# Patient Record
Sex: Female | Born: 1975
Health system: Southern US, Community
[De-identification: ages and names within clinical notes are randomized; demographics above are authoritative.]

---

## 2017-02-19 DIAGNOSIS — R030 Elevated blood-pressure reading, without diagnosis of hypertension: Secondary | ICD-10-CM | POA: Diagnosis not present

## 2017-02-19 DIAGNOSIS — L989 Disorder of the skin and subcutaneous tissue, unspecified: Secondary | ICD-10-CM | POA: Diagnosis not present

## 2017-02-19 DIAGNOSIS — G2581 Restless legs syndrome: Secondary | ICD-10-CM | POA: Diagnosis not present

## 2017-02-19 DIAGNOSIS — F4329 Adjustment disorder with other symptoms: Secondary | ICD-10-CM | POA: Diagnosis not present

## 2017-03-12 ENCOUNTER — Other Ambulatory Visit: Payer: Self-pay | Admitting: Family Medicine

## 2017-03-12 DIAGNOSIS — Z1231 Encounter for screening mammogram for malignant neoplasm of breast: Secondary | ICD-10-CM

## 2017-05-19 DIAGNOSIS — Z23 Encounter for immunization: Secondary | ICD-10-CM | POA: Diagnosis not present

## 2017-05-19 DIAGNOSIS — Z0000 Encounter for general adult medical examination without abnormal findings: Secondary | ICD-10-CM | POA: Diagnosis not present

## 2017-05-19 DIAGNOSIS — Z124 Encounter for screening for malignant neoplasm of cervix: Secondary | ICD-10-CM | POA: Diagnosis not present

## 2017-05-19 DIAGNOSIS — R87610 Atypical squamous cells of undetermined significance on cytologic smear of cervix (ASC-US): Secondary | ICD-10-CM | POA: Diagnosis not present

## 2017-05-19 DIAGNOSIS — E611 Iron deficiency: Secondary | ICD-10-CM | POA: Diagnosis not present

## 2017-05-19 DIAGNOSIS — Z13220 Encounter for screening for lipoid disorders: Secondary | ICD-10-CM | POA: Diagnosis not present

## 2017-05-20 ENCOUNTER — Ambulatory Visit
Admission: RE | Admit: 2017-05-20 | Discharge: 2017-05-20 | Disposition: A | Discharge status: Home / Self Care | Payer: Federal, State, Local not specified - PPO | Source: Ambulatory Visit | Attending: Family Medicine | Admitting: Family Medicine

## 2017-05-20 DIAGNOSIS — Z1231 Encounter for screening mammogram for malignant neoplasm of breast: Secondary | ICD-10-CM

## 2017-09-16 DIAGNOSIS — E559 Vitamin D deficiency, unspecified: Secondary | ICD-10-CM | POA: Diagnosis not present

## 2017-09-16 DIAGNOSIS — D508 Other iron deficiency anemias: Secondary | ICD-10-CM | POA: Diagnosis not present

## 2017-09-16 DIAGNOSIS — M25531 Pain in right wrist: Secondary | ICD-10-CM | POA: Diagnosis not present

## 2017-09-16 DIAGNOSIS — F4329 Adjustment disorder with other symptoms: Secondary | ICD-10-CM | POA: Diagnosis not present

## 2017-09-16 DIAGNOSIS — E539 Vitamin B deficiency, unspecified: Secondary | ICD-10-CM | POA: Diagnosis not present

## 2017-10-06 DIAGNOSIS — R200 Anesthesia of skin: Secondary | ICD-10-CM | POA: Diagnosis not present

## 2018-06-03 DIAGNOSIS — Z23 Encounter for immunization: Secondary | ICD-10-CM | POA: Diagnosis not present

## 2018-06-03 DIAGNOSIS — M62838 Other muscle spasm: Secondary | ICD-10-CM | POA: Diagnosis not present

## 2018-06-03 DIAGNOSIS — F4329 Adjustment disorder with other symptoms: Secondary | ICD-10-CM | POA: Diagnosis not present

## 2018-06-03 DIAGNOSIS — M545 Low back pain: Secondary | ICD-10-CM | POA: Diagnosis not present

## 2018-08-23 DIAGNOSIS — M79641 Pain in right hand: Secondary | ICD-10-CM | POA: Diagnosis not present

## 2018-08-23 DIAGNOSIS — M79642 Pain in left hand: Secondary | ICD-10-CM | POA: Diagnosis not present

## 2018-08-23 DIAGNOSIS — R200 Anesthesia of skin: Secondary | ICD-10-CM | POA: Diagnosis not present

## 2018-08-26 ENCOUNTER — Encounter: Payer: Self-pay | Admitting: Neurology

## 2018-08-26 ENCOUNTER — Other Ambulatory Visit: Payer: Self-pay | Admitting: *Deleted

## 2018-08-26 DIAGNOSIS — M79641 Pain in right hand: Secondary | ICD-10-CM

## 2018-08-26 DIAGNOSIS — M79642 Pain in left hand: Principal | ICD-10-CM

## 2018-09-10 DIAGNOSIS — E538 Deficiency of other specified B group vitamins: Secondary | ICD-10-CM | POA: Diagnosis not present

## 2018-09-10 DIAGNOSIS — D508 Other iron deficiency anemias: Secondary | ICD-10-CM | POA: Diagnosis not present

## 2018-09-10 DIAGNOSIS — E559 Vitamin D deficiency, unspecified: Secondary | ICD-10-CM | POA: Diagnosis not present

## 2018-09-10 DIAGNOSIS — Z136 Encounter for screening for cardiovascular disorders: Secondary | ICD-10-CM | POA: Diagnosis not present

## 2018-09-15 DIAGNOSIS — Z0000 Encounter for general adult medical examination without abnormal findings: Secondary | ICD-10-CM | POA: Diagnosis not present

## 2018-09-16 ENCOUNTER — Other Ambulatory Visit: Payer: Self-pay | Admitting: Family Medicine

## 2018-09-16 ENCOUNTER — Ambulatory Visit (INDEPENDENT_AMBULATORY_CARE_PROVIDER_SITE_OTHER): Payer: Federal, State, Local not specified - PPO | Admitting: Neurology

## 2018-09-16 DIAGNOSIS — G5603 Carpal tunnel syndrome, bilateral upper limbs: Secondary | ICD-10-CM

## 2018-09-16 DIAGNOSIS — N6320 Unspecified lump in the left breast, unspecified quadrant: Secondary | ICD-10-CM

## 2018-09-16 DIAGNOSIS — M79642 Pain in left hand: Secondary | ICD-10-CM

## 2018-09-16 DIAGNOSIS — M79641 Pain in right hand: Secondary | ICD-10-CM

## 2018-09-16 NOTE — Procedures (Signed)
[facility]  [address], [address]  [place], [place] [address] Tel: [phone] Fax:  [phone] Test Date:  09/16/2018  Patient: Sharon Davis DOB: 1976/03/08 Physician: Nita Sickle, DO  Sex: Female Height: 5\' 2"  Ref Phys: Cindee Salt, MD  ID#: [id number] Temp: 35.0C Technician:    Patient Complaints: This is a 43 year old female referred for evaluation of bilateral hand tingling and pain, worse on the left.  NCV & EMG Findings: Extensive electrodiagnostic testing of the left upper extremity and additional studies of the right shows:  1. Left mixed palmar sensory responses show prolonged latency.  Right median sensory response shows prolonged distal peak latency (3.5 ms) and reduced amplitude (18.7 V).  Left median and bilateral ulnar sensory responses are within normal limits. 2. Right median motor response shows prolonged distal onset latency (4.3 ms).  Left median and bilateral ulnar motor responses are within normal limits.   3. Chronic motor axonal loss changes are seen in the right abductor pollicis brevis muscle, without accompanied active denervation.    Impression: 1. Right median neuropathy at or distal to the wrist, consistent with a clinical diagnosis of carpal tunnel syndrome, moderate in degree electrically. 2. Left median neuropathy at or distal to the wrist, consistent with a clinical diagnosis of carpal tunnel syndrome, very mild in degree electrically. 3. There is no evidence of a cervical radiculopathy affecting the upper extremities.    ___________________________ Nita Sickle, DO    Nerve Conduction Studies Anti Sensory Summary Table   Site NR Peak (ms) Norm Peak (ms) P-T Amp (V) Norm P-T Amp  Left Median Anti Sensory (2nd Digit)  35C  Wrist    3.0 <3.4 49.2 >20  Right Median Anti Sensory (2nd Digit)  35C  Wrist    3.5 <3.4 18.7 >20  Left Ulnar Anti Sensory (5th Digit)  35C  Wrist    2.6 <3.1 40.9 >12  Right Ulnar Anti Sensory  (5th Digit)  35C  Wrist    2.3 <3.1 42.7 >12   Motor Summary Table   Site NR Onset (ms) Norm Onset (ms) O-P Amp (mV) Norm O-P Amp Site1 Site2 Delta-0 (ms) Dist (cm) Vel (m/s) Norm Vel (m/s)  Left Median Motor (Abd Poll Brev)  35C  Wrist    3.0 <3.9 13.0 >6 Elbow Wrist 4.3 27.0 63 >50  Elbow    7.3  12.4         Right Median Motor (Abd Poll Brev)  35C  Wrist    4.3 <3.9 8.1 >6 Elbow Wrist 4.4 27.0 61 >50  Elbow    8.7  8.0         Left Ulnar Motor (Abd Dig Minimi)  35C  Wrist    2.1 <3.1 10.7 >7 B Elbow Wrist 3.4 22.5 66 >50  B Elbow    5.5  10.4  A Elbow B Elbow 1.5 10.0 67 >50  A Elbow    7.0  10.4         Right Ulnar Motor (Abd Dig Minimi)  35C  Wrist    1.9 <3.1 9.4 >7 B Elbow Wrist 3.3 22.0 67 >50  B Elbow    5.2  9.0  A Elbow B Elbow 1.5 10.0 67 >50  A Elbow    6.7  8.7          Comparison Summary Table   Site NR Peak (ms) Norm Peak (ms) P-T Amp (V) Site1 Site2 Delta-P (ms) Norm Delta (ms)  Left  Median/Ulnar Palm Comparison (Wrist - 8cm)  35C  Median Palm    1.9 <2.2 112.0 Median Palm Ulnar Palm 0.5   Ulnar Palm    1.4 <2.2 55.0       EMG   Side Muscle Ins Act Fibs Psw Fasc Number Recrt Dur Dur. Amp Amp. Poly Poly. Comment  Right 1stDorInt Nml Nml Nml Nml Nml Nml Nml Nml Nml Nml Nml Nml N/A  Right Abd Poll Brev Nml Nml Nml Nml 1- Rapid Few 1+ Few 1+ Nml Nml N/A  Right PronatorTeres Nml Nml Nml Nml Nml Nml Nml Nml Nml Nml Nml Nml N/A  Right Biceps Nml Nml Nml Nml Nml Nml Nml Nml Nml Nml Nml Nml N/A  Right Deltoid Nml Nml Nml Nml Nml Nml Nml Nml Nml Nml Nml Nml N/A  Right Triceps Nml Nml Nml Nml Nml Nml Nml Nml Nml Nml Nml Nml N/A  Left 1stDorInt Nml Nml Nml Nml Nml Nml Nml Nml Nml Nml Nml Nml N/A  Left Abd Poll Brev Nml Nml Nml Nml Nml Nml Nml Nml Nml Nml Nml Nml N/A  Left PronatorTeres Nml Nml Nml Nml Nml Nml Nml Nml Nml Nml Nml Nml N/A  Left Biceps Nml Nml Nml Nml Nml Nml Nml Nml Nml Nml Nml Nml N/A  Left Triceps Nml Nml Nml Nml Nml Nml Nml Nml Nml Nml Nml Nml  N/A  Left Deltoid Nml Nml Nml Nml Nml Nml Nml Nml Nml Nml Nml Nml N/A      Waveforms:

## 2018-09-23 ENCOUNTER — Ambulatory Visit
Admission: RE | Admit: 2018-09-23 | Discharge: 2018-09-23 | Disposition: A | Discharge status: Home / Self Care | Payer: Federal, State, Local not specified - PPO | Source: Ambulatory Visit | Attending: Family Medicine | Admitting: Family Medicine

## 2018-09-23 DIAGNOSIS — N6320 Unspecified lump in the left breast, unspecified quadrant: Secondary | ICD-10-CM

## 2018-09-23 DIAGNOSIS — N6001 Solitary cyst of right breast: Secondary | ICD-10-CM | POA: Diagnosis not present

## 2018-09-23 DIAGNOSIS — R922 Inconclusive mammogram: Secondary | ICD-10-CM | POA: Diagnosis not present

## 2018-10-01 DIAGNOSIS — R200 Anesthesia of skin: Secondary | ICD-10-CM | POA: Diagnosis not present

## 2019-01-17 DIAGNOSIS — R200 Anesthesia of skin: Secondary | ICD-10-CM | POA: Diagnosis not present

## 2019-01-17 DIAGNOSIS — M542 Cervicalgia: Secondary | ICD-10-CM | POA: Diagnosis not present

## 2019-01-17 DIAGNOSIS — M5412 Radiculopathy, cervical region: Secondary | ICD-10-CM | POA: Diagnosis not present

## 2019-01-17 DIAGNOSIS — M79602 Pain in left arm: Secondary | ICD-10-CM | POA: Diagnosis not present

## 2019-01-17 DIAGNOSIS — Z79899 Other long term (current) drug therapy: Secondary | ICD-10-CM | POA: Diagnosis not present

## 2019-01-20 DIAGNOSIS — G5601 Carpal tunnel syndrome, right upper limb: Secondary | ICD-10-CM | POA: Diagnosis not present

## 2019-01-20 DIAGNOSIS — M542 Cervicalgia: Secondary | ICD-10-CM | POA: Diagnosis not present

## 2019-02-02 DIAGNOSIS — M50322 Other cervical disc degeneration at C5-C6 level: Secondary | ICD-10-CM | POA: Diagnosis not present

## 2019-02-02 DIAGNOSIS — M2578 Osteophyte, vertebrae: Secondary | ICD-10-CM | POA: Diagnosis not present

## 2019-02-02 DIAGNOSIS — M4802 Spinal stenosis, cervical region: Secondary | ICD-10-CM | POA: Diagnosis not present

## 2019-02-02 DIAGNOSIS — M542 Cervicalgia: Secondary | ICD-10-CM | POA: Diagnosis not present

## 2019-09-28 DIAGNOSIS — M25511 Pain in right shoulder: Secondary | ICD-10-CM | POA: Diagnosis not present

## 2019-09-30 DIAGNOSIS — M25511 Pain in right shoulder: Secondary | ICD-10-CM | POA: Diagnosis not present

## 2019-10-04 DIAGNOSIS — M25511 Pain in right shoulder: Secondary | ICD-10-CM | POA: Diagnosis not present

## 2019-10-07 DIAGNOSIS — M25511 Pain in right shoulder: Secondary | ICD-10-CM | POA: Diagnosis not present

## 2019-10-10 DIAGNOSIS — M25511 Pain in right shoulder: Secondary | ICD-10-CM | POA: Diagnosis not present

## 2019-10-13 DIAGNOSIS — M25511 Pain in right shoulder: Secondary | ICD-10-CM | POA: Diagnosis not present

## 2019-10-17 DIAGNOSIS — M25511 Pain in right shoulder: Secondary | ICD-10-CM | POA: Diagnosis not present

## 2019-10-20 DIAGNOSIS — M25511 Pain in right shoulder: Secondary | ICD-10-CM | POA: Diagnosis not present

## 2019-10-24 DIAGNOSIS — M25511 Pain in right shoulder: Secondary | ICD-10-CM | POA: Diagnosis not present

## 2019-11-03 DIAGNOSIS — M25511 Pain in right shoulder: Secondary | ICD-10-CM | POA: Diagnosis not present

## 2019-11-07 DIAGNOSIS — M25511 Pain in right shoulder: Secondary | ICD-10-CM | POA: Diagnosis not present

## 2019-11-11 DIAGNOSIS — M7501 Adhesive capsulitis of right shoulder: Secondary | ICD-10-CM | POA: Diagnosis not present

## 2019-12-16 DIAGNOSIS — M7501 Adhesive capsulitis of right shoulder: Secondary | ICD-10-CM | POA: Diagnosis not present

## 2020-03-23 IMAGING — US ULTRASOUND LEFT BREAST LIMITED
1 series · 7 of 7 positions shown · non-contrast
Comparison: Previous exam(s).

CLINICAL DATA: Palpable lump on the left.

EXAM:
DIGITAL DIAGNOSTIC BILATERAL MAMMOGRAM WITH CAD AND TOMO
ULTRASOUND LEFT BREAST

[Series 1: ultrasound left breast limited · 0.06mm/px · 7 of 7 slices shown]
[im 1/7]
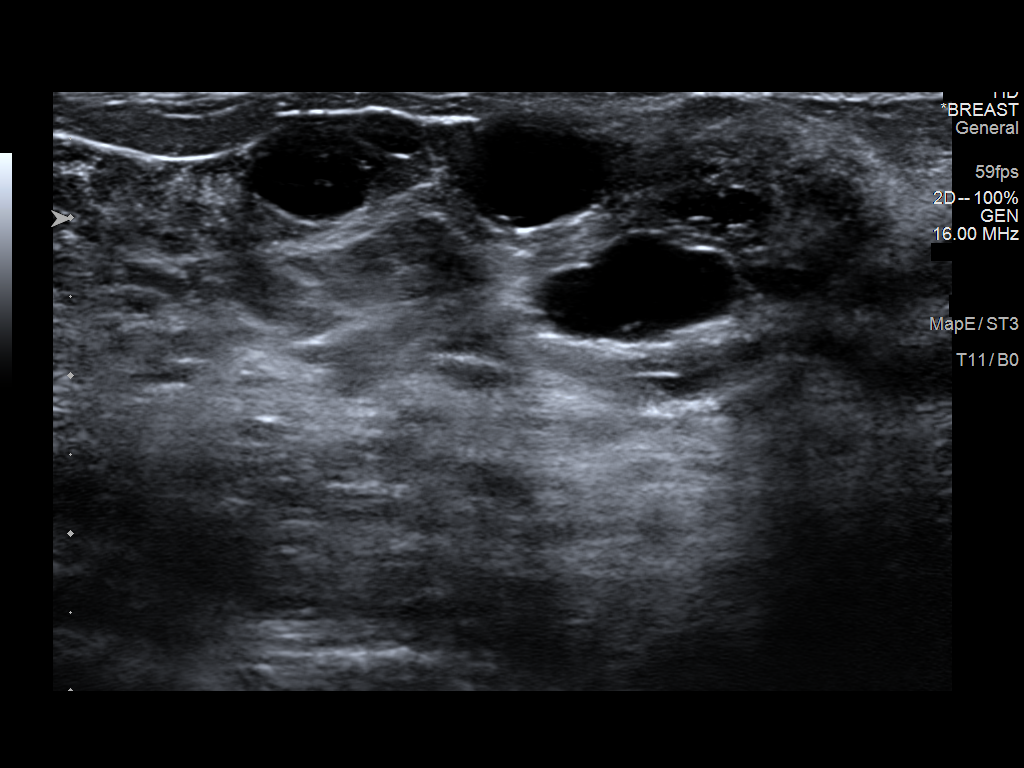
[im 2/7]
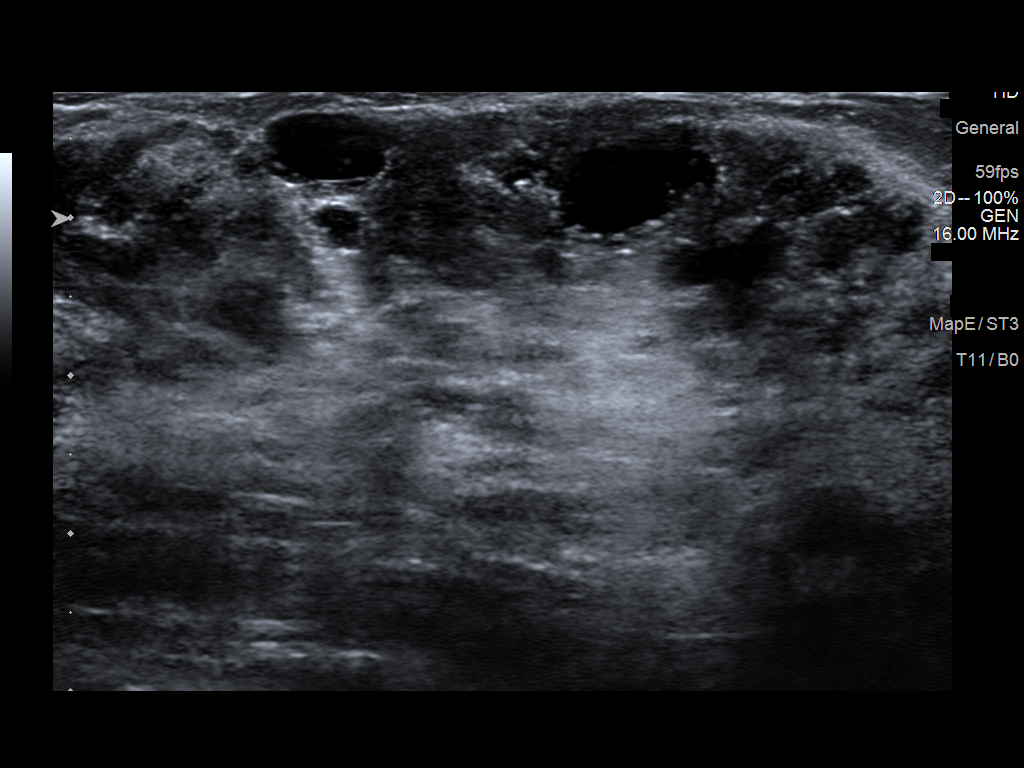
[im 3/7]
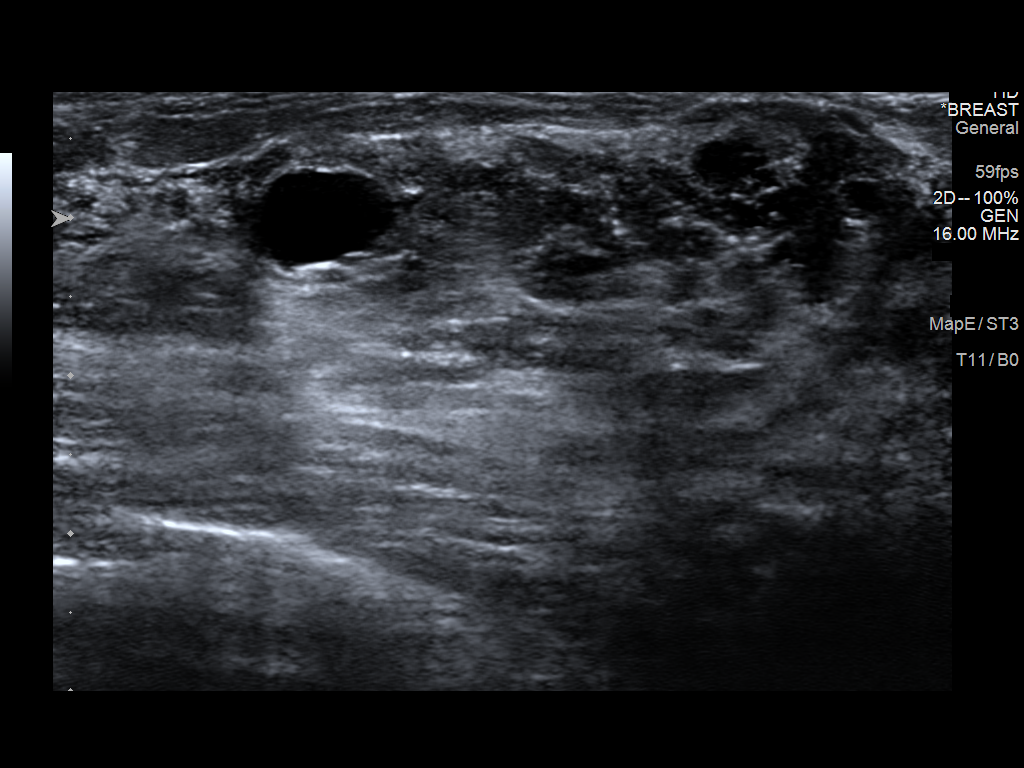
[im 4/7]
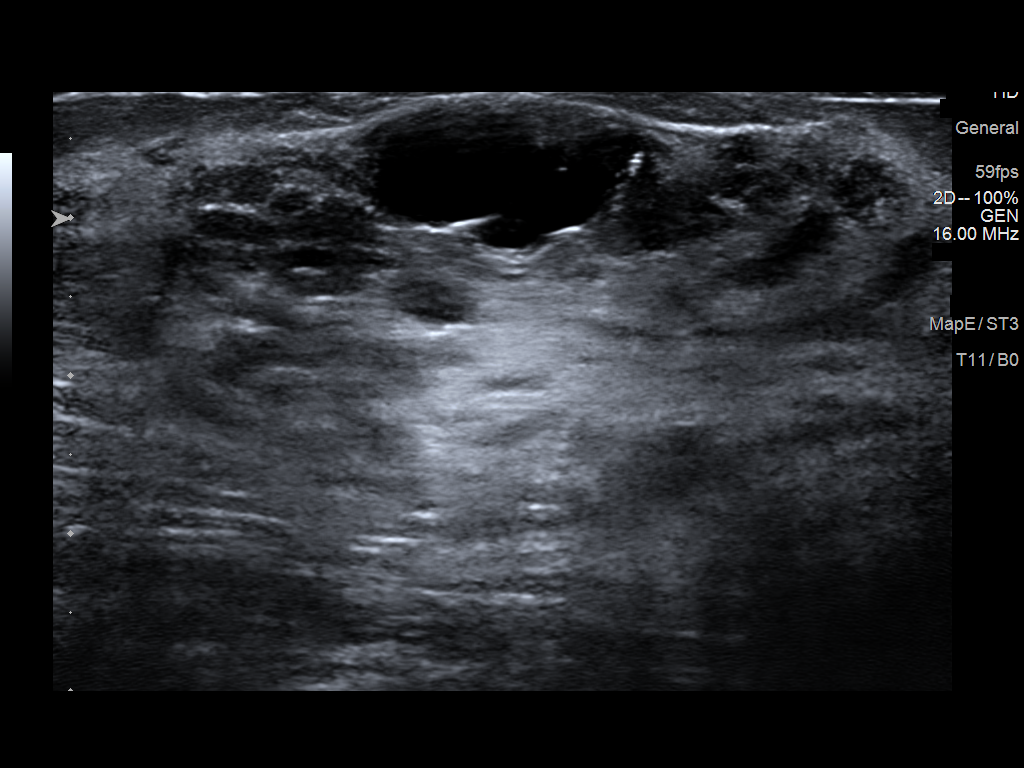
[im 5/7]
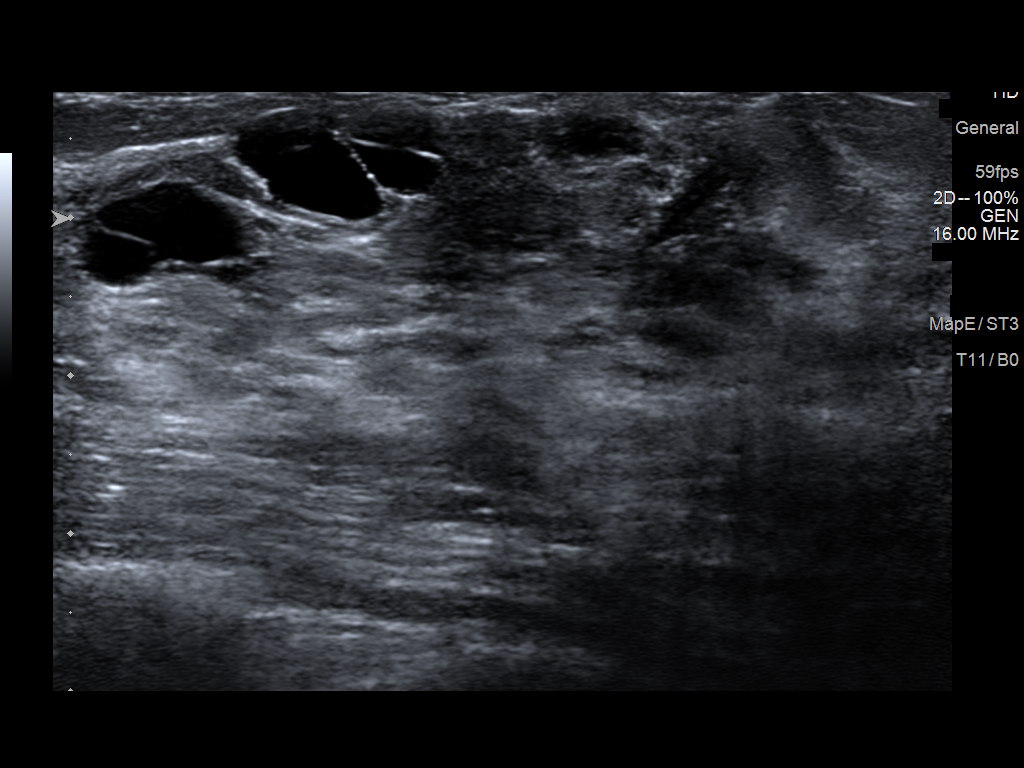
[im 6/7]
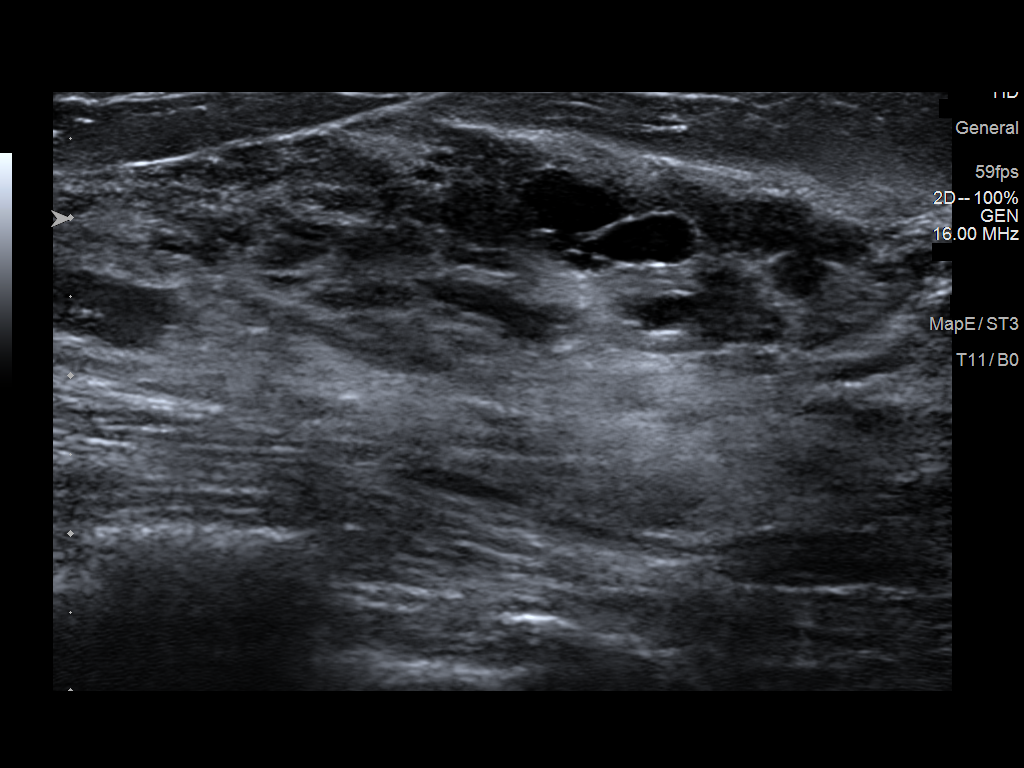
[im 7/7]
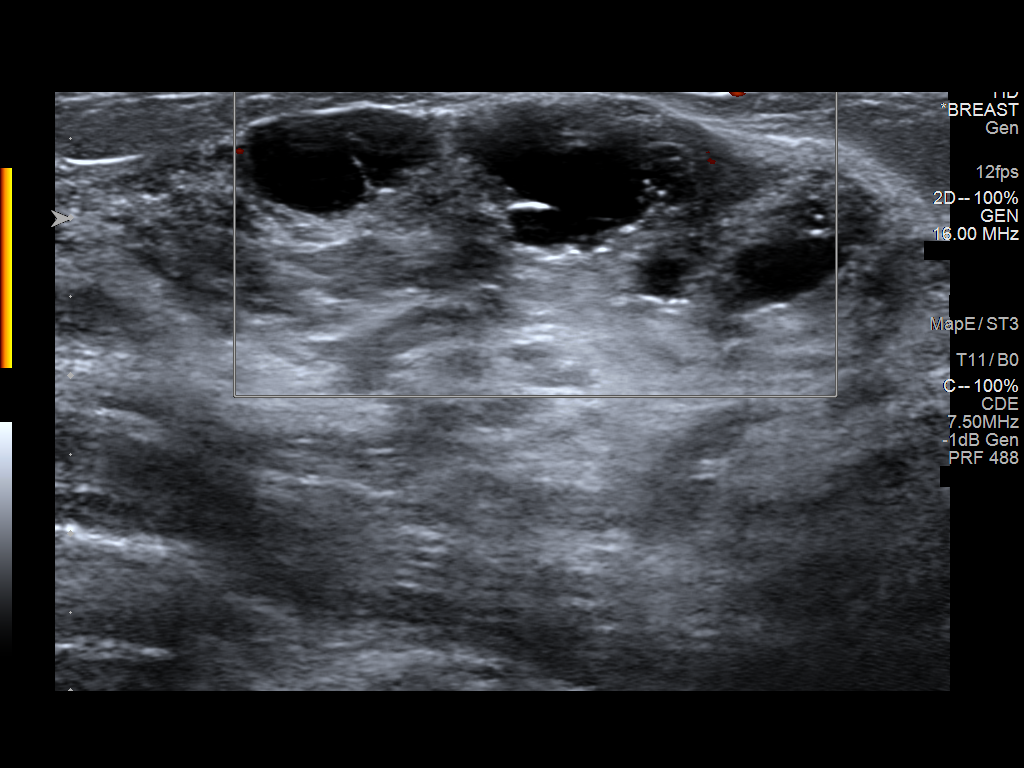

[7 of 7 positions shown; findings below may reference images not displayed]

ACR Breast Density Category d: The breast tissue is extremely dense,
which lowers the sensitivity of mammography.
FINDINGS: No suspicious masses, calcifications, or distortion. Possible
obscured masses in the left breast suggesting fibrocystic change.
Benign bilateral calcifications. Layering in these calcifications on
the left.

Mammographic images were processed with CAD.

On physical exam, no suspicious lumps are identified.

Targeted ultrasound is performed, showing fibrocystic changes at the
site of the patient's symptoms.
IMPRESSION: Fibrocystic changes.  No evidence of malignancy.

RECOMMENDATION:
Annual screening mammography.

I have discussed the findings and recommendations with the patient.
Results were also provided in writing at the conclusion of the
visit. If applicable, a reminder letter will be sent to the patient
regarding the next appointment.

BI-RADS CATEGORY  2: Benign.

## 2020-03-23 IMAGING — MG DIGITAL DIAGNOSTIC BILATERAL MAMMOGRAM WITH TOMO AND CAD
6 of 10 series · 6 of 30 positions shown · non-contrast
Comparison: Previous exam(s).

CLINICAL DATA: Palpable lump on the left.

EXAM:
DIGITAL DIAGNOSTIC BILATERAL MAMMOGRAM WITH CAD AND TOMO
ULTRASOUND LEFT BREAST

[L MLO synth-2D]
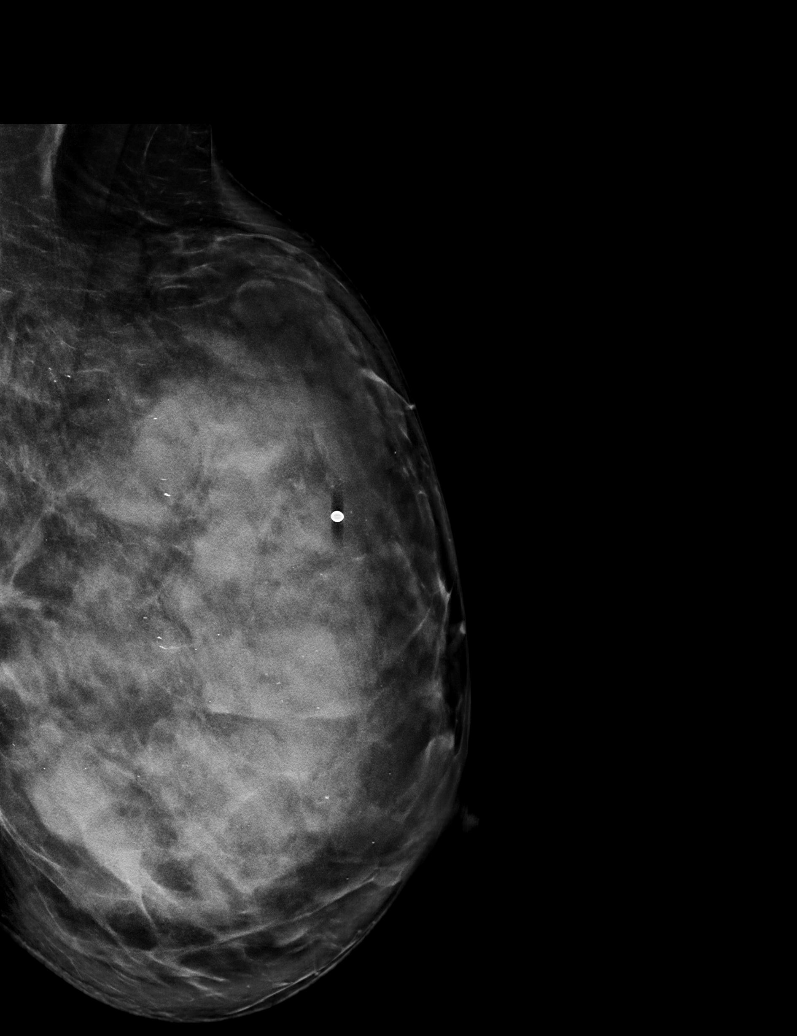

[L CC synth-2D]
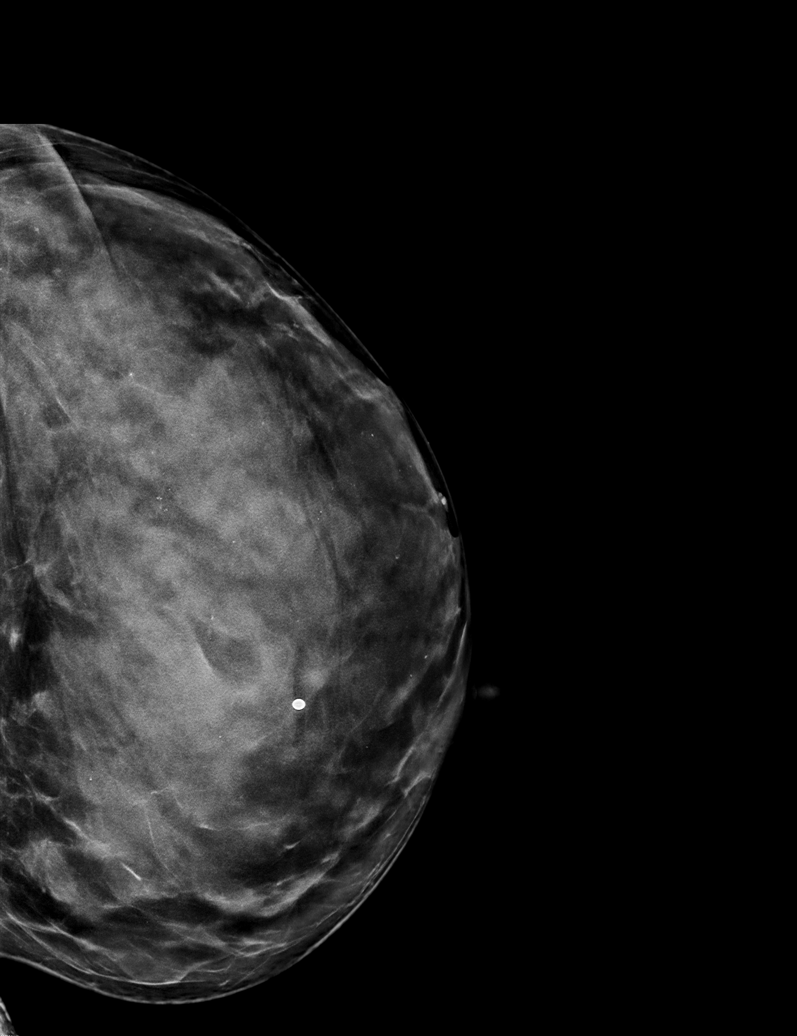

[L TAN synth-2D]
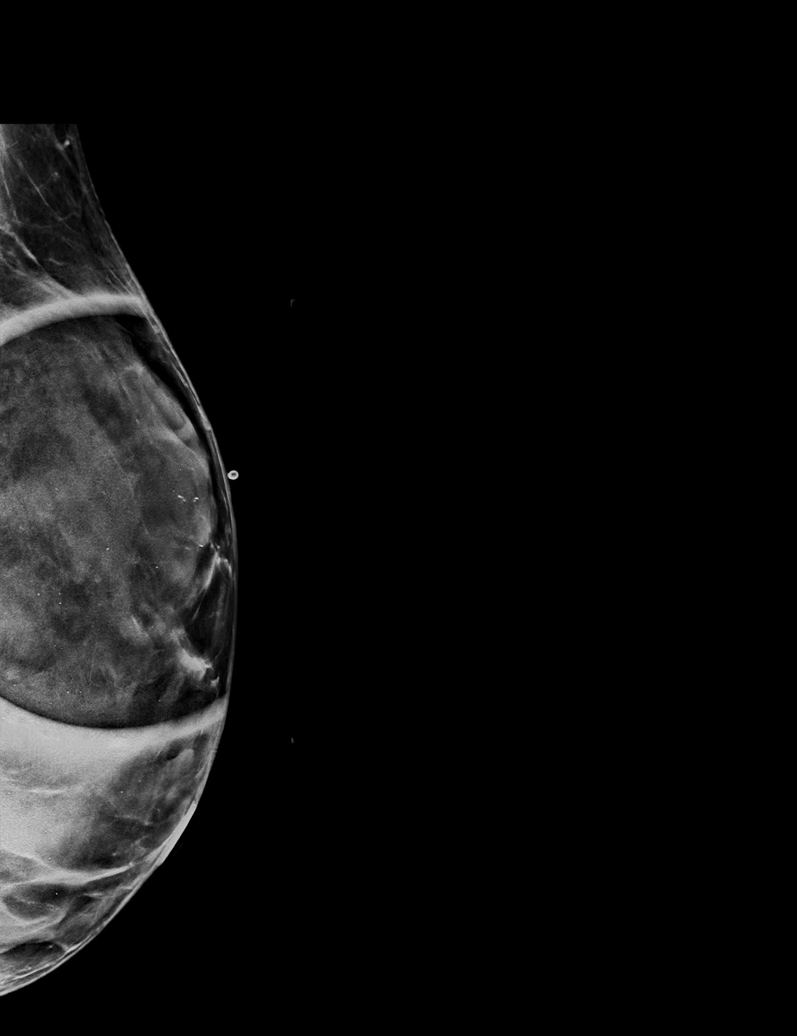

[R MLO synth-2D]
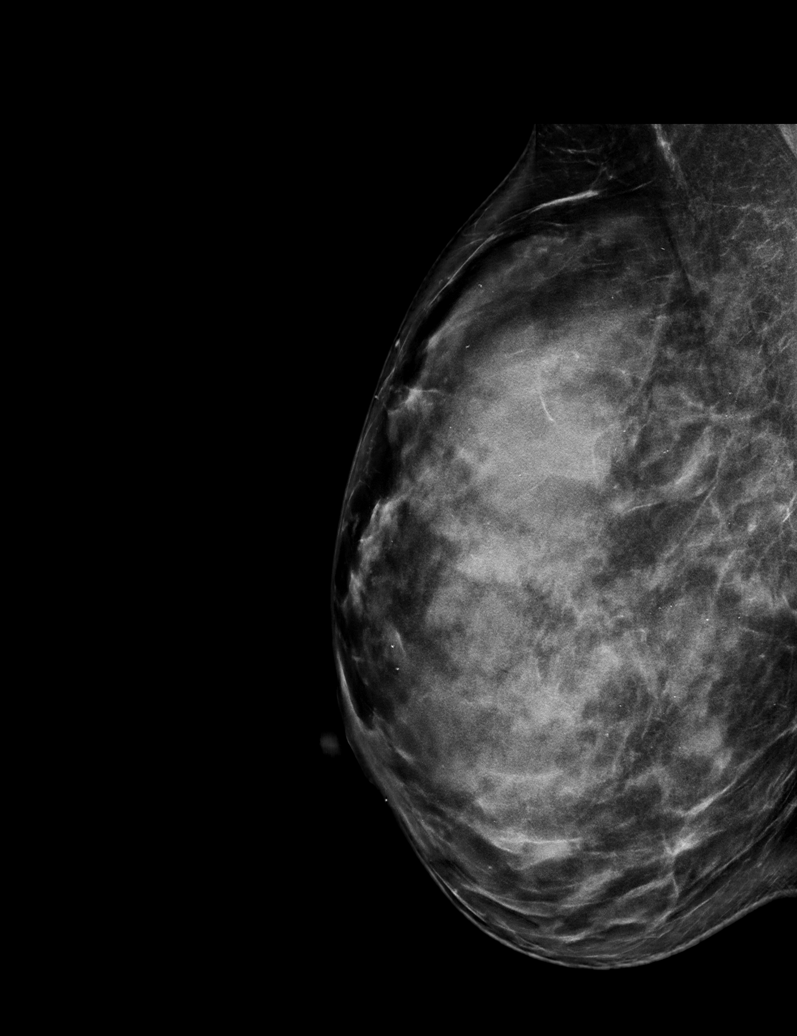

[R CC synth-2D]
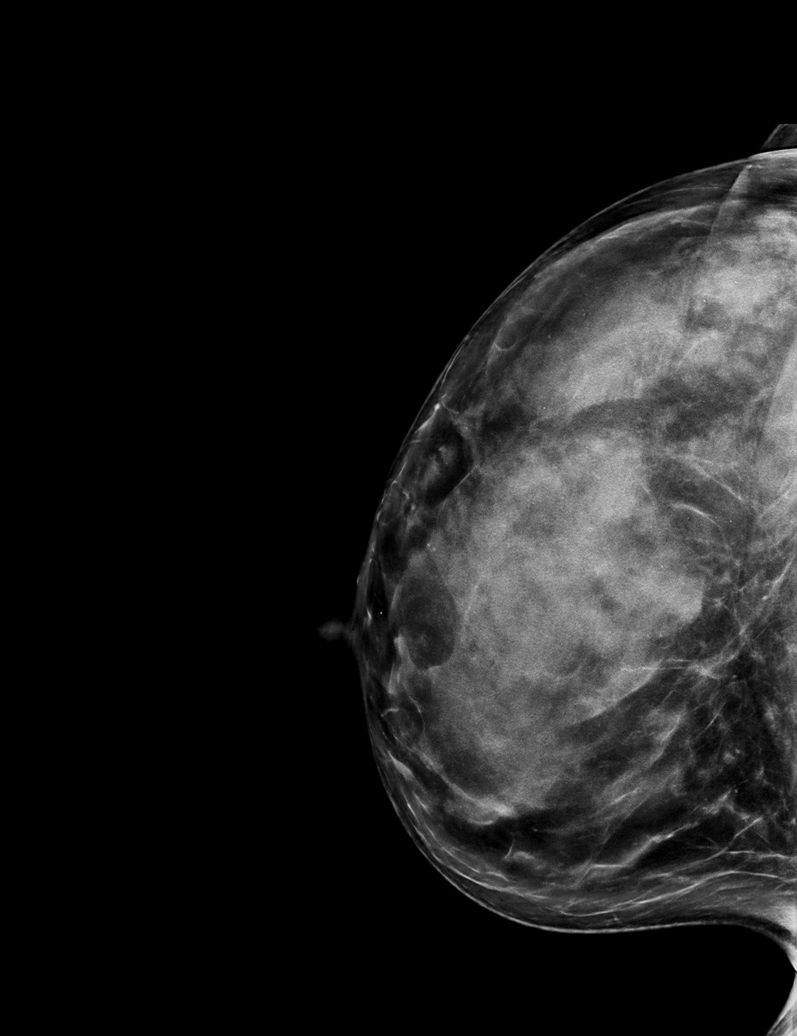

[R CC tomo · tomo slice 43/84.0]
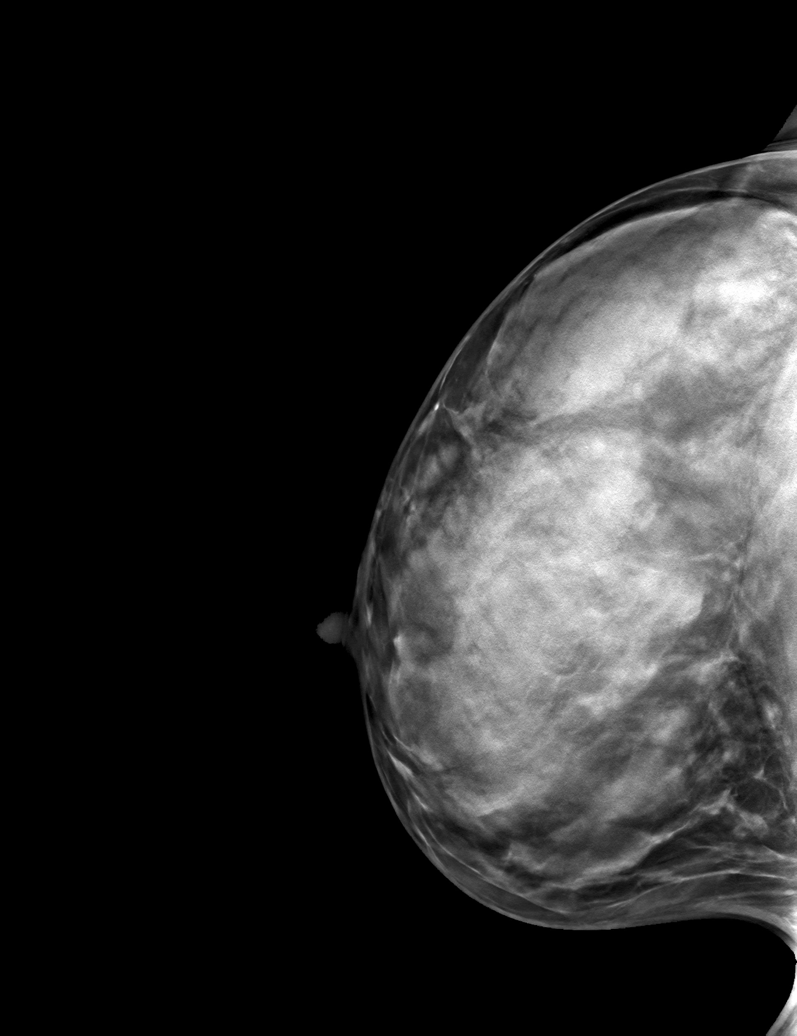

[6 of 30 positions shown; findings below may reference images not displayed]

ACR Breast Density Category d: The breast tissue is extremely dense,
which lowers the sensitivity of mammography.
FINDINGS: No suspicious masses, calcifications, or distortion. Possible
obscured masses in the left breast suggesting fibrocystic change.
Benign bilateral calcifications. Layering in these calcifications on
the left.

Mammographic images were processed with CAD.

On physical exam, no suspicious lumps are identified.

Targeted ultrasound is performed, showing fibrocystic changes at the
site of the patient's symptoms.
IMPRESSION: Fibrocystic changes.  No evidence of malignancy.

RECOMMENDATION:
Annual screening mammography.

I have discussed the findings and recommendations with the patient.
Results were also provided in writing at the conclusion of the
visit. If applicable, a reminder letter will be sent to the patient
regarding the next appointment.

BI-RADS CATEGORY  2: Benign.

## 2020-08-27 DIAGNOSIS — T192XXA Foreign body in vulva and vagina, initial encounter: Secondary | ICD-10-CM | POA: Diagnosis not present

## 2021-01-19 DIAGNOSIS — L03011 Cellulitis of right finger: Secondary | ICD-10-CM | POA: Diagnosis not present

## 2022-06-20 DIAGNOSIS — F411 Generalized anxiety disorder: Secondary | ICD-10-CM | POA: Diagnosis not present

## 2022-07-23 ENCOUNTER — Other Ambulatory Visit: Payer: Self-pay | Admitting: Family Medicine

## 2022-07-23 DIAGNOSIS — Z0000 Encounter for general adult medical examination without abnormal findings: Secondary | ICD-10-CM | POA: Diagnosis not present

## 2022-07-23 DIAGNOSIS — Z1231 Encounter for screening mammogram for malignant neoplasm of breast: Secondary | ICD-10-CM

## 2022-07-23 DIAGNOSIS — Z124 Encounter for screening for malignant neoplasm of cervix: Secondary | ICD-10-CM | POA: Diagnosis not present

## 2022-07-23 DIAGNOSIS — R87610 Atypical squamous cells of undetermined significance on cytologic smear of cervix (ASC-US): Secondary | ICD-10-CM | POA: Diagnosis not present

## 2022-07-23 DIAGNOSIS — Z1211 Encounter for screening for malignant neoplasm of colon: Secondary | ICD-10-CM | POA: Diagnosis not present

## 2022-07-23 DIAGNOSIS — Z23 Encounter for immunization: Secondary | ICD-10-CM | POA: Diagnosis not present

## 2022-07-25 DIAGNOSIS — D508 Other iron deficiency anemias: Secondary | ICD-10-CM | POA: Diagnosis not present

## 2022-07-25 DIAGNOSIS — Z13220 Encounter for screening for lipoid disorders: Secondary | ICD-10-CM | POA: Diagnosis not present

## 2022-07-25 DIAGNOSIS — Z6827 Body mass index (BMI) 27.0-27.9, adult: Secondary | ICD-10-CM | POA: Diagnosis not present

## 2022-08-11 ENCOUNTER — Ambulatory Visit
Admission: RE | Admit: 2022-08-11 | Discharge: 2022-08-11 | Disposition: A | Discharge status: Home / Self Care | Payer: Federal, State, Local not specified - PPO | Source: Ambulatory Visit | Attending: Family Medicine | Admitting: Family Medicine

## 2022-08-11 DIAGNOSIS — Z1231 Encounter for screening mammogram for malignant neoplasm of breast: Secondary | ICD-10-CM | POA: Diagnosis not present

## 2023-01-21 DIAGNOSIS — M25562 Pain in left knee: Secondary | ICD-10-CM | POA: Diagnosis not present

## 2023-10-22 DIAGNOSIS — F411 Generalized anxiety disorder: Secondary | ICD-10-CM | POA: Diagnosis not present

## 2023-12-08 ENCOUNTER — Other Ambulatory Visit: Payer: Self-pay | Admitting: Family Medicine

## 2023-12-08 DIAGNOSIS — Z1231 Encounter for screening mammogram for malignant neoplasm of breast: Secondary | ICD-10-CM

## 2023-12-15 ENCOUNTER — Ambulatory Visit
Admission: RE | Admit: 2023-12-15 | Discharge: 2023-12-15 | Disposition: A | Discharge status: Home / Self Care | Source: Ambulatory Visit | Attending: Family Medicine | Admitting: Family Medicine

## 2023-12-15 DIAGNOSIS — Z1231 Encounter for screening mammogram for malignant neoplasm of breast: Secondary | ICD-10-CM | POA: Diagnosis not present

## 2024-02-16 DIAGNOSIS — L03211 Cellulitis of face: Secondary | ICD-10-CM | POA: Diagnosis not present

## 2024-03-20 DIAGNOSIS — S90862A Insect bite (nonvenomous), left foot, initial encounter: Secondary | ICD-10-CM | POA: Diagnosis not present
